# Patient Record
Sex: Male | Born: 1995 | Race: Black or African American | Hispanic: No | Marital: Single | Smoking: Never smoker
Health system: Southern US, Academic
[De-identification: ages and names within clinical notes are randomized; demographics above are authoritative.]

## PROBLEM LIST (undated history)

## (undated) ENCOUNTER — Emergency Department (HOSPITAL_COMMUNITY): Admission: EM | Payer: Self-pay

## (undated) DIAGNOSIS — F319 Bipolar disorder, unspecified: Secondary | ICD-10-CM

## (undated) DIAGNOSIS — F988 Other specified behavioral and emotional disorders with onset usually occurring in childhood and adolescence: Secondary | ICD-10-CM

## (undated) DIAGNOSIS — J45909 Unspecified asthma, uncomplicated: Secondary | ICD-10-CM

## (undated) DIAGNOSIS — F429 Obsessive-compulsive disorder, unspecified: Secondary | ICD-10-CM

## (undated) DIAGNOSIS — R569 Unspecified convulsions: Secondary | ICD-10-CM

## (undated) DIAGNOSIS — R6250 Unspecified lack of expected normal physiological development in childhood: Secondary | ICD-10-CM

## (undated) HISTORY — DX: Unspecified lack of expected normal physiological development in childhood: R62.50

## (undated) HISTORY — DX: Unspecified convulsions (CMS HCC): R56.9

---
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
## 2014-02-10 ENCOUNTER — Other Ambulatory Visit (INDEPENDENT_AMBULATORY_CARE_PROVIDER_SITE_OTHER): Payer: Self-pay | Admitting: General Practice

## 2014-02-10 DIAGNOSIS — G40909 Epilepsy, unspecified, not intractable, without status epilepticus: Secondary | ICD-10-CM

## 2014-02-10 MED ORDER — LAMOTRIGINE 25 MG TABLET
25.0000 mg | ORAL_TABLET | Freq: Two times a day (BID) | ORAL | Status: DC
Start: 2014-02-10 — End: 2015-02-09

## 2014-02-10 MED ORDER — LAMOTRIGINE 100 MG TABLET
100.0000 mg | ORAL_TABLET | Freq: Two times a day (BID) | ORAL | Status: DC
Start: 2014-02-10 — End: 2015-02-09

## 2014-02-10 MED ORDER — LAMOTRIGINE 200 MG TABLET
200.0000 mg | ORAL_TABLET | Freq: Two times a day (BID) | ORAL | Status: DC
Start: 2014-02-10 — End: 2015-02-09

## 2014-02-10 NOTE — Telephone Encounter (Signed)
Spoke with denise and verified lamictal refill dosages and preferred pharmacy [facility]  Called lamictal 200 mg,100mg  and 25 mg refills to [facility]

## 2014-02-10 NOTE — Telephone Encounter (Signed)
-----   Message from Hardin Negusena McDonald sent at 02/10/2014 12:09 PM EST -----  >> [facility] 02/10/2014 12:09 PM  Dr Charolotte EkePergami pt- Angelique BlonderDenise is calling to get Rochell Puett refill on    lamoTRIgine (LAMICTAL) 100 mg Oral Tablet, Take 1 Tab (100 mg total) by mouth Twice daily, Disp: 60 Tab, Rfl: 8  LamoTRIgine (LAMICTAL) 200 mg Oral Tablet, Take 1 Tab (200 mg total) by mouth Twice daily, Disp: 30 Tab, Rfl: 8  lamoTRIgine (LAMICTAL) 25 mg Oral Tablet, Take 1 Tab (25 mg total) by mouth Twice daily, Disp: 60 Tab, Rfl: 8      Preferred Pharmacy     [facility] [facility] [place], [place] - [address]    [address] [place] [id number]    Phone: [phone] Fax: [phone]    Open 24 Hours?: No

## 2014-04-30 ENCOUNTER — Ambulatory Visit
Payer: MEDICAID | Attending: PSYCHIATRY AND NEUROLOGY-NEUROLOGY WITH SPECIAL QUALIFICATIONS IN CHILD NEUROLOGY | Admitting: PSYCHIATRY AND NEUROLOGY-NEUROLOGY WITH SPECIAL QUALIFICATIONS IN CHILD NEUROLOGY

## 2014-04-30 VITALS — BP 111/72 | HR 88 | Temp 98.6°F | Ht 71.34 in | Wt 174.4 lb

## 2014-04-30 DIAGNOSIS — G40909 Epilepsy, unspecified, not intractable, without status epilepticus: Secondary | ICD-10-CM

## 2014-04-30 DIAGNOSIS — F319 Bipolar disorder, unspecified: Secondary | ICD-10-CM | POA: Insufficient documentation

## 2014-04-30 NOTE — Progress Notes (Signed)
CC: epilepsy since 2007; has bipolar  The [facility] that is scanned in to today's visit, was reviewed and additions added to the note and letter. Vitals were reviewed with the following noted as ok    grandmother related the following:  Typical seizure - eyes roll, shakes all over with arms in the air;   Last seizure - Feb 2015  Had 6 seizures in 2014 and the one in Feb of last year  She can tell he is going into one by His look;  Thinks better sleep has helped him - trazodone has helped as treating His bipolar    Follows with Dr. Rosina LowensteinJhafri - feels he does not focus on medication but helps them with how to deal with behavior    EXAM  General appearance - no acute distress  Mental status - alert  Language- age appropriate  Gait - no ataxia  Coordination - RAM  Cranial Nerves - intact  Strength 5/5 throughout; normal tone  DTRs - 2+  Throughout      A/P epilepsy   - continue Lamictal at current dose   - we would keep him 4 years   - RTC 6 months - then will look to send to adult neurologist

## 2014-12-04 ENCOUNTER — Inpatient Hospital Stay
Admit: 2014-12-04 | Discharge: 2014-12-04 | Disposition: A | Discharge status: Home / Self Care | Payer: Self-pay | Attending: Emergency Medicine

## 2014-12-04 DIAGNOSIS — F1290 Cannabis use, unspecified, uncomplicated: Secondary | ICD-10-CM

## 2014-12-04 LAB — DRUG SCREEN, URINE
AMPHETAMINES: NEGATIVE
BARBITURATES: NEGATIVE
BENZODIAZEPINES: NEGATIVE
COCAINE: NEGATIVE
METHADONE: NEGATIVE
OPIATES: NEGATIVE
PCP(PHENCYCLIDINE): NEGATIVE
THC (TH-CANNABINOL): POSITIVE — AB

## 2014-12-04 LAB — EKG, 12 LEAD, INITIAL
Atrial Rate: 114 {beats}/min
Calculated P Axis: 62 degrees
Calculated R Axis: 61 degrees
Calculated T Axis: 39 degrees
P-R Interval: 148 ms
Q-T Interval: 326 ms
QRS Duration: 80 ms
QTC Calculation (Bezet): 449 ms
Ventricular Rate: 114 {beats}/min

## 2014-12-04 NOTE — ED Triage Notes (Signed)
Patient brought in by EMS, tonight at his cousins house and had "two hits" of marijuana and has never smoked before. Patient started feeling anxious.

## 2014-12-04 NOTE — ED Notes (Signed)
I have reviewed discharge instructions with the patient and parent.  The patient and parent verbalized understanding. Patient armband removed and shredded. Patient ambulated to lobby with mother with steady gait.

## 2014-12-04 NOTE — ED Provider Notes (Signed)
HPI Comments:   12:21 AM    Ian Boyle is a 19 y.o. Male with hx of bipolar disorder and epilepsy presents to ED via EMS with mother C/O palpitations throat burning, and dizziness s/p smoking marijuana for the first time PTA. Pt's mother reports he takes 650 mg Lamictal, Trazodone, and Abilify daily but has not taken his medications today. He denies any other drug use. Pt denies chest pain, SOB, abdominal pain, and any other symptoms or complaints.         Patient is a 19 y.o. male presenting with anxiety. The history is provided by the patient. No language interpreter was used.   Anxiety    This is a new problem. Episode onset: PTA. The problem has not changed since onset.The problem occurs constantly. Associated symptoms include dizziness and palpitations. Pertinent negatives include no abdominal pain and no shortness of breath.        Past Medical History:   Diagnosis Date   ??? Bipolar affective disorder (HCC)    ??? Epilepsy (HCC)        History reviewed. No pertinent past surgical history.      History reviewed. No pertinent family history.    Social History     Social History   ??? Marital status: SINGLE     Spouse name: N/A   ??? Number of children: N/A   ??? Years of education: N/A     Occupational History   ??? Not on file.     Social History Main Topics   ??? Smoking status: Never Smoker   ??? Smokeless tobacco: Not on file   ??? Alcohol use No   ??? Drug use: Yes     Special: Marijuana   ??? Sexual activity: Not on file     Other Topics Concern   ??? Not on file     Social History Narrative   ??? No narrative on file         ALLERGIES: Augmentin [amoxicillin-pot clavulanate] and Pcn [penicillins]    Review of Systems   HENT: Positive for sore throat (burning).    Respiratory: Negative for shortness of breath.    Cardiovascular: Positive for palpitations. Negative for chest pain.   Gastrointestinal: Negative for abdominal pain.   Neurological: Positive for dizziness.   All other systems reviewed and are negative.      Vitals:     12/04/14 0016 12/04/14 0138   BP: (!) 147/92 121/75   Pulse: (!) 119 87   Resp: 16 18   Temp: 98.3 ??F (36.8 ??C)    SpO2: 100% 100%   Weight: 81.6 kg (180 lb)    Height: 6\' 1"  (1.854 m)             Physical Exam   Constitutional: He is oriented to person, place, and time. He appears well-developed and well-nourished. No distress.   Lying quietly on bed, alert, oriented x 4, appears anxious    HENT:   Head: Normocephalic and atraumatic.   Mouth/Throat: Uvula is midline and oropharynx is clear and moist. Mucous membranes are dry.   Eyes: EOM are normal. Pupils are equal, round, and reactive to light.   Blood shot and glassy eyes  Sluggish pupillary response    Neck: Normal range of motion. Neck supple.   Cardiovascular: Regular rhythm, normal heart sounds and intact distal pulses.  Tachycardia present.    No murmur heard.  Pulmonary/Chest: Effort normal and breath sounds normal. No respiratory distress. He has no  wheezes. He has no rales.   Abdominal: Soft. Bowel sounds are normal. He exhibits no distension. There is no tenderness. There is no rebound and no guarding.   Neurological: He is alert and oriented to person, place, and time.   Skin: Skin is warm and dry. No rash noted.   Psychiatric: His speech is normal. Judgment and thought content normal. His mood appears anxious. He is slowed. He is not agitated, not aggressive, not hyperactive, not withdrawn, not actively hallucinating and not combative. Cognition and memory are normal. He is attentive.   Nursing note and vitals reviewed.     RESULTS:    EKG interpretation: (Preliminary)  0:35 AM   Sinus tachycardia with occasional premature ventricular complexes, 114bpm, otherwise normal ECG  EKG read by Gwen Her, PA-C      No orders to display        Labs Reviewed   DRUG SCREEN, URINE - Abnormal; Notable for the following:        Result Value    THC (TH-CANNABINOL) POSITIVE (*)     All other components within normal limits        No results found for this or any previous visit (from the past 12 hour(s)).     MDM  Number of Diagnoses or Management Options  Anxiety:   Marijuana use:   Diagnosis management comments: Anxiety, marijuana use, r/o other drug use       Amount and/or Complexity of Data Reviewed  Tests in the medicine section of CPT??: ordered and reviewed (EKG)  Independent visualization of images, tracings, or specimens: yes (EKG)      ED Course     MEDICATIONS GIVEN:  Medications - No data to display     Procedures    PROGRESS NOTE:  12:21 AM   Initial assessment performed.    DISCUSSION:  Pt anxious after smoking marijuana for the first time tonight, UDS + for marijuana only. Pt initially tachycardic with elevated BP but normal HR and BP after resting and drinking water. EKG NSR with occasional PVC. Pt stable for d/c. Mother at bedside     DISCHARGE NOTE:  1:10 AM   Cleotilde Neer  results have been reviewed with him.  He has been counseled regarding his diagnosis, treatment, and plan.  He verbally conveys understanding and agreement of the signs, symptoms, diagnosis, treatment and prognosis and additionally agrees to follow up as discussed.  He also agrees with the care-plan and conveys that all of his questions have been answered.  I have also provided discharge instructions for him that include: educational information regarding their diagnosis and treatment, and list of reasons why they would want to return to the ED prior to their follow-up appointment, should his condition change. The patient and/or family has been provided with education for proper Emergency Department utilization.    CLINICAL IMPRESSION:    1. Marijuana use    2. Anxiety        PLAN: DISCHARGE HOME    Follow-up Information     Follow up With Details Comments Contact Info    [facility] Schedule an appointment as soon as possible for a visit  [address] [place] [address]  [place] [address]  [phone]     Esperanza Richters, DO Schedule an appointment as soon as possible for a visit Follow up with internal medicine  [address] [place] [address]  [phone]  [facility] EMERGENCY DEPT  As needed, If symptoms worsen [address]  [place] [address]  [phone]          Discharge Medication List as of 12/04/2014  1:17 AM      CONTINUE these medications which have NOT CHANGED    Details   lamoTRIgine (LAMICTAL) 100 mg tablet Take 650 mg by mouth two (2) times a day., Historical Med      traZODone (DESYREL) 100 mg tablet Take 25 mg by mouth nightly., Historical Med      albuterol (PROAIR HFA) 90 mcg/actuation inhaler Take 2 Puffs by inhalation every four (4) hours as needed for Wheezing., Historical Med      OLANZapine (ZYPREXA) 5 mg tablet Take 5 mg by mouth nightly., Historical Med             ATTESTATIONS:  This note is prepared by Marchia Meiers, acting as Scribe for [employer], PA-C .    Gwen Her, PA-C : The scribe's documentation has been prepared under my direction and personally reviewed by me in its entirety. I confirm that the note above accurately reflects all work, treatment, procedures, and medical decision making performed by me.

## 2015-02-09 ENCOUNTER — Other Ambulatory Visit (INDEPENDENT_AMBULATORY_CARE_PROVIDER_SITE_OTHER): Payer: Self-pay | Admitting: PSYCHIATRY AND NEUROLOGY-NEUROLOGY WITH SPECIAL QUALIFICATIONS IN CHILD NEUROLOGY

## 2015-02-09 DIAGNOSIS — G40909 Epilepsy, unspecified, not intractable, without status epilepticus: Secondary | ICD-10-CM

## 2015-02-09 MED ORDER — LAMOTRIGINE 100 MG TABLET
100.0000 mg | ORAL_TABLET | Freq: Two times a day (BID) | ORAL | Status: DC
Start: 2015-02-09 — End: 2015-09-27

## 2015-02-09 MED ORDER — LAMOTRIGINE 200 MG TABLET
200.0000 mg | ORAL_TABLET | Freq: Two times a day (BID) | ORAL | Status: DC
Start: 2015-02-09 — End: 2015-09-27

## 2015-02-09 MED ORDER — LAMOTRIGINE 25 MG TABLET
25.0000 mg | ORAL_TABLET | Freq: Two times a day (BID) | ORAL | Status: DC
Start: 2015-02-09 — End: 2015-09-27

## 2015-02-09 NOTE — Telephone Encounter (Signed)
-----   Message from Almyra BraceLisa Garrison sent at 02/09/2015 10:12 AM EST -----  >> LISA GARRISON 02/09/2015 10:12 AM  Doctor Name:   PED NEURO/DR Okey DupreJAYNES       Date of last appointment:  1.30.17    Next scheduled visit:  2.6.16    Medication Requested:    lamoTRIgine (LAMICTAL) 100 mg Oral Tablet, Take 1 Tab (100 mg total) by mouth Twice daily  LamoTRIgine (LAMICTAL) 200 mg Oral Tablet, Take 1 Tab (200 mg total) by mouth Twice daily  lamoTRIgine (LAMICTAL) 25 mg Oral Tablet, Take 1 Tab (25 mg total) by mouth Twice daily        Medication issues or side effects that need reported to nurse or physician:  N/Ayzia Day    Preferred Pharmacy: Preferred Pharmacy     [facility] - [place], [place] - [address]    [address] [place] [id number]    Phone: [phone] Fax: [phone]    Open 24 Hours?: No          Notes for Nurse or Physician:  N/Caylen Yardley

## 2015-02-09 NOTE — Telephone Encounter (Signed)
Spoke with denise and verified dosage lamictal 200 mg,100mg , and 25 mg for refills [facility] as requested

## 2015-04-24 ENCOUNTER — Encounter (INDEPENDENT_AMBULATORY_CARE_PROVIDER_SITE_OTHER): Payer: MEDICAID | Admitting: PSYCHIATRY AND NEUROLOGY-NEUROLOGY WITH SPECIAL QUALIFICATIONS IN CHILD NEUROLOGY

## 2015-09-25 ENCOUNTER — Encounter (INDEPENDENT_AMBULATORY_CARE_PROVIDER_SITE_OTHER): Payer: MEDICAID | Admitting: PSYCHIATRY AND NEUROLOGY-NEUROLOGY WITH SPECIAL QUALIFICATIONS IN CHILD NEUROLOGY

## 2015-09-27 ENCOUNTER — Encounter (INDEPENDENT_AMBULATORY_CARE_PROVIDER_SITE_OTHER): Payer: Self-pay | Admitting: PSYCHIATRY AND NEUROLOGY-NEUROLOGY WITH SPECIAL QUALIFICATIONS IN CHILD NEUROLOGY

## 2015-09-27 ENCOUNTER — Ambulatory Visit
Payer: MEDICAID | Attending: PSYCHIATRY AND NEUROLOGY-NEUROLOGY WITH SPECIAL QUALIFICATIONS IN CHILD NEUROLOGY | Admitting: PSYCHIATRY AND NEUROLOGY-NEUROLOGY WITH SPECIAL QUALIFICATIONS IN CHILD NEUROLOGY

## 2015-09-27 DIAGNOSIS — Z79899 Other long term (current) drug therapy: Secondary | ICD-10-CM | POA: Insufficient documentation

## 2015-09-27 DIAGNOSIS — F319 Bipolar disorder, unspecified: Secondary | ICD-10-CM | POA: Insufficient documentation

## 2015-09-27 DIAGNOSIS — G40909 Epilepsy, unspecified, not intractable, without status epilepticus: Secondary | ICD-10-CM | POA: Insufficient documentation

## 2015-09-27 MED ORDER — LAMOTRIGINE 200 MG TABLET
200.0000 mg | ORAL_TABLET | Freq: Two times a day (BID) | ORAL | 8 refills | Status: AC
Start: 2015-09-27 — End: ?

## 2015-09-27 MED ORDER — LAMOTRIGINE 100 MG TABLET
100.0000 mg | ORAL_TABLET | Freq: Two times a day (BID) | ORAL | 8 refills | Status: AC
Start: 2015-09-27 — End: ?

## 2015-09-27 MED ORDER — LAMOTRIGINE 25 MG TABLET
25.0000 mg | ORAL_TABLET | Freq: Two times a day (BID) | ORAL | 8 refills | Status: AC
Start: 2015-09-27 — End: ?

## 2015-09-27 NOTE — Progress Notes (Signed)
CC: epilepsy since 2007; has bipolar  The [facility] that is scanned in to today's visit, was reviewed and additions added to the note and letter. Vitals were reviewed with the following noted as ok    grandmother related the following:  Typical seizure - eyes roll, shakes all over with arms in the air;   Last seizure - late Feb 2017  Had 6 seizures in 2014 and the one in Feb of last year  She can tell he is going into one by His look;    Follows with Dr. Rosina LowensteinJhafri - feels he does not focus on medication but helps them with how to deal with behavior    EXAM  Vitals:    09/27/15 1327   BP: 116/82   Pulse: 73   Temp: 36.6 C (97.9 F)   TempSrc: Thermal Scan   SpO2: 99%   Weight: 84.1 kg (185 lb 6.5 oz)   Height: 1.799 m (5' 10.83")   HC: 57 cm (22.44")       General appearance - no acute distress  Mental status - alert  Language- age appropriate  Gait - no ataxia  Coordination - RAM  Cranial Nerves - intact  Strength 5/5 throughout; normal tone  DTRs - 2+  Throughout      A/P epilepsy   - continue Lamictal at current dose   - we would keep him on meds for 4 years seizure free   - RTC 6 months - then will look to send to adult neurologist

## 2016-04-10 ENCOUNTER — Encounter (INDEPENDENT_AMBULATORY_CARE_PROVIDER_SITE_OTHER): Payer: MEDICAID | Admitting: PSYCHIATRY AND NEUROLOGY-NEUROLOGY WITH SPECIAL QUALIFICATIONS IN CHILD NEUROLOGY

## 2016-04-27 ENCOUNTER — Encounter (INDEPENDENT_AMBULATORY_CARE_PROVIDER_SITE_OTHER): Payer: MEDICAID | Admitting: PSYCHIATRY AND NEUROLOGY-NEUROLOGY WITH SPECIAL QUALIFICATIONS IN CHILD NEUROLOGY

## 2016-07-15 ENCOUNTER — Encounter (INDEPENDENT_AMBULATORY_CARE_PROVIDER_SITE_OTHER): Payer: MEDICAID | Admitting: PSYCHIATRY AND NEUROLOGY-NEUROLOGY WITH SPECIAL QUALIFICATIONS IN CHILD NEUROLOGY

## 2017-06-19 ENCOUNTER — Ambulatory Visit (INDEPENDENT_AMBULATORY_CARE_PROVIDER_SITE_OTHER): Payer: Self-pay

## 2017-06-19 NOTE — Telephone Encounter (Signed)
Mom is going to call PCP to get referral to adult neurology as has not been seen for over 2 years

## 2017-06-19 NOTE — Telephone Encounter (Signed)
-----   Message from Hardin Negusena McDonald sent at 06/19/2017  1:59 PM EDT -----  Dr Okey DupreJaynes pt- mom is calling to find out if Dr Okey DupreJaynes will continue to see this 22 yr old. He has been having twitching.

## 2019-01-20 ENCOUNTER — Other Ambulatory Visit: Payer: Self-pay

## 2019-01-20 ENCOUNTER — Emergency Department (HOSPITAL_COMMUNITY)
Admission: EM | Admit: 2019-01-20 | Discharge: 2019-01-20 | Discharge status: Against Medical Advice | Payer: Self-pay | Attending: Emergency Medicine | Admitting: Emergency Medicine

## 2019-01-20 ENCOUNTER — Encounter (HOSPITAL_COMMUNITY): Payer: Self-pay | Admitting: Emergency Medicine

## 2019-01-20 ENCOUNTER — Emergency Department (HOSPITAL_COMMUNITY): Payer: Self-pay

## 2019-01-20 DIAGNOSIS — Z5321 Procedure and treatment not carried out due to patient leaving prior to being seen by health care provider: Secondary | ICD-10-CM | POA: Insufficient documentation

## 2019-01-20 HISTORY — DX: Bipolar disorder, unspecified: F31.9

## 2019-01-20 HISTORY — DX: Obsessive-compulsive disorder, unspecified: F42.9

## 2019-01-20 HISTORY — DX: Unspecified asthma, uncomplicated: J45.909

## 2019-01-20 HISTORY — DX: Other specified behavioral and emotional disorders with onset usually occurring in childhood and adolescence: F98.8

## 2019-01-20 HISTORY — DX: Unspecified convulsions: R56.9

## 2019-01-20 NOTE — ED Notes (Signed)
Patient advised nurse that he is leaving and will see his MD in the morning .

## 2019-01-20 NOTE — ED Triage Notes (Signed)
Pt with c/o cough, fever of 102, emesis and diarrhea. He works at [employer] and is concerned that he may have gotten COVID.

## 2019-01-21 ENCOUNTER — Emergency Department (HOSPITAL_COMMUNITY)
Admission: EM | Admit: 2019-01-21 | Discharge: 2019-01-21 | Disposition: A | Discharge status: Home / Self Care | Payer: Self-pay | Attending: Emergency Medicine | Admitting: Emergency Medicine

## 2019-01-21 ENCOUNTER — Encounter (HOSPITAL_COMMUNITY): Payer: Self-pay | Admitting: Emergency Medicine

## 2019-01-21 DIAGNOSIS — R197 Diarrhea, unspecified: Secondary | ICD-10-CM | POA: Insufficient documentation

## 2019-01-21 DIAGNOSIS — R0789 Other chest pain: Secondary | ICD-10-CM | POA: Insufficient documentation

## 2019-01-21 DIAGNOSIS — R112 Nausea with vomiting, unspecified: Secondary | ICD-10-CM | POA: Insufficient documentation

## 2019-01-21 DIAGNOSIS — F17210 Nicotine dependence, cigarettes, uncomplicated: Secondary | ICD-10-CM | POA: Insufficient documentation

## 2019-01-21 DIAGNOSIS — Z20828 Contact with and (suspected) exposure to other viral communicable diseases: Secondary | ICD-10-CM | POA: Insufficient documentation

## 2019-01-21 DIAGNOSIS — J029 Acute pharyngitis, unspecified: Secondary | ICD-10-CM | POA: Insufficient documentation

## 2019-01-21 DIAGNOSIS — Z20822 Contact with and (suspected) exposure to covid-19: Secondary | ICD-10-CM

## 2019-01-21 DIAGNOSIS — F909 Attention-deficit hyperactivity disorder, unspecified type: Secondary | ICD-10-CM | POA: Insufficient documentation

## 2019-01-21 DIAGNOSIS — J069 Acute upper respiratory infection, unspecified: Secondary | ICD-10-CM

## 2019-01-21 DIAGNOSIS — F319 Bipolar disorder, unspecified: Secondary | ICD-10-CM | POA: Insufficient documentation

## 2019-01-21 LAB — CBC WITH DIFFERENTIAL/PLATELET
Abs Immature Granulocytes: 0.02 10*3/uL (ref 0.00–0.07)
Basophils Absolute: 0.1 10*3/uL (ref 0.0–0.1)
Basophils Relative: 1 %
Eosinophils Absolute: 0.2 10*3/uL (ref 0.0–0.5)
Eosinophils Relative: 5 %
HCT: 48 % (ref 39.0–52.0)
Hemoglobin: 15.1 g/dL (ref 13.0–17.0)
Immature Granulocytes: 1 %
Lymphocytes Relative: 41 %
Lymphs Abs: 1.5 10*3/uL (ref 0.7–4.0)
MCH: 29.2 pg (ref 26.0–34.0)
MCHC: 31.5 g/dL (ref 30.0–36.0)
MCV: 92.8 fL (ref 80.0–100.0)
Monocytes Absolute: 0.4 10*3/uL (ref 0.1–1.0)
Monocytes Relative: 11 %
Neutro Abs: 1.4 10*3/uL — ABNORMAL LOW (ref 1.7–7.7)
Neutrophils Relative %: 41 %
Platelets: 307 10*3/uL (ref 150–400)
RBC: 5.17 MIL/uL (ref 4.22–5.81)
RDW: 14.6 % (ref 11.5–15.5)
WBC: 3.5 10*3/uL — ABNORMAL LOW (ref 4.0–10.5)
nRBC: 0 % (ref 0.0–0.2)

## 2019-01-21 LAB — COMPREHENSIVE METABOLIC PANEL
ALT: 18 U/L (ref 0–44)
AST: 19 U/L (ref 15–41)
Albumin: 4 g/dL (ref 3.5–5.0)
Alkaline Phosphatase: 43 U/L (ref 38–126)
Anion gap: 9 (ref 5–15)
BUN: 11 mg/dL (ref 6–20)
CO2: 24 mmol/L (ref 22–32)
Calcium: 9.3 mg/dL (ref 8.9–10.3)
Chloride: 108 mmol/L (ref 98–111)
Creatinine, Ser: 0.84 mg/dL (ref 0.61–1.24)
GFR calc Af Amer: >60 mL/min (ref >60)
GFR calc non Af Amer: >60 mL/min (ref >60)
Glucose, Bld: 110 mg/dL — ABNORMAL HIGH (ref 70–99)
Potassium: 4.7 mmol/L (ref 3.5–5.1)
Sodium: 141 mmol/L (ref 135–145)
Total Bilirubin: 0.4 mg/dL (ref 0.3–1.2)
Total Protein: 7 g/dL (ref 6.5–8.1)

## 2019-01-21 MED ORDER — ONDANSETRON HCL 4 MG PO TABS: 4.0000 mg | ORAL_TABLET | Freq: Three times a day (TID) | ORAL | 0 refills | Status: AC | PRN

## 2019-01-21 MED ORDER — BENZONATATE 100 MG PO CAPS: 100.0000 mg | ORAL_CAPSULE | Freq: Three times a day (TID) | ORAL | 0 refills | Status: AC

## 2019-01-21 MED ORDER — ONDANSETRON 4 MG PO TBDP: 4.0000 mg | ORAL_TABLET | Freq: Once | ORAL | Status: DC

## 2019-01-21 MED ORDER — ALBUTEROL SULFATE HFA 108 (90 BASE) MCG/ACT IN AERS
4.0000 | INHALATION_SPRAY | Freq: Once | RESPIRATORY_TRACT | Status: AC
  Administered 2019-01-21: 4 via RESPIRATORY_TRACT
  Filled 2019-01-21: qty 6.7

## 2019-01-21 NOTE — ED Provider Notes (Addendum)
[facility] EMERGENCY DEPARTMENT Provider Note   CSN: [id number] Arrival date & time: 01/21/19  1216     History   Chief Complaint Chief Complaint  Patient presents with  . Fever    HPI Keith Pearson is a 23 y.o. male.     HPI    Patient is a 23 year old male with a history of ADHD, asthma, bipolar 1, OCD, seizures, who presents the emergency department today for evaluation of multiple complaints and concern for the coronavirus.  Patient reports that he has had dry cough, fevers, nausea, vomiting and diarrhea for the last 5 to 6 days.  He denies any significant abdominal discomfort.  He does report some chest tightness.  He has little bit of nasal congestion and a mild sore throat as well.  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  He does not know that he has been around anybody with positive Covid testing.  He does have concern for this.  Past Medical History:  Diagnosis Date  . ADD (attention deficit disorder)   . Asthma   . Bipolar 1 disorder ([place])   . OCD (obsessive compulsive disorder)   . Seizures ([place])     There are no active problems to display for this patient.   History reviewed. No pertinent surgical history.      Home Medications    Prior to Admission medications   Medication Sig Start Date End Date Taking? Authorizing Provider  benzonatate (TESSALON) 100 MG capsule Take 1 capsule (100 mg total) by mouth every 8 (eight) hours for 5 days. 01/21/19 01/26/19  Joseh Sjogren S, PA-C  ondansetron (ZOFRAN) 4 MG tablet Take 1 tablet (4 mg total) by mouth every 8 (eight) hours as needed for nausea or vomiting. 01/21/19   Rachard Isidro S, PA-C    Family History History reviewed. No pertinent family history.  Social History Social History   Tobacco Use  . Smoking status: Current Every Day Smoker    Types:  Cigarettes  . Smokeless tobacco: Never Used  Substance Use Topics  . Alcohol use: Yes  . Drug use: Yes    Types: Marijuana     Allergies   Augmentin [amoxicillin-pot clavulanate] and Penicillins   Review of Systems Review of Systems  Constitutional: Positive for fever.  HENT: Positive for congestion and sore throat. Negative for ear pain.   Eyes: Negative for pain and visual disturbance.  Respiratory: Positive for cough. Negative for shortness of breath.   Cardiovascular: Positive for chest pain (tightness).  Gastrointestinal: Positive for diarrhea, nausea and vomiting. Negative for abdominal pain.  Genitourinary: Negative for dysuria and hematuria.  Musculoskeletal: Negative for back pain.  Skin: Negative for rash.  Neurological: Negative for headaches.  All other systems reviewed and are negative.    Physical Exam Updated Vital Signs BP (!) 148/92 (BP Location: Right Arm)   Pulse 89   Temp 97.6 F (36.4 C) (Oral)   Resp 18   SpO2 100%   Physical Exam Vitals signs and nursing note reviewed.  Constitutional:      General: He is not in acute distress.    Appearance: He is well-developed. He is not ill-appearing or toxic-appearing.  HENT:     Head: Normocephalic and atraumatic.     Mouth/Throat:     Pharynx: No oropharyngeal exudate or posterior oropharyngeal erythema.  Comments: No tonsillar edema or exudates.  Eyes:     Conjunctiva/sclera: Conjunctivae normal.  Neck:     Musculoskeletal: Neck supple.  Cardiovascular:     Rate and Rhythm: Normal rate and regular rhythm.     Pulses: Normal pulses.     Heart sounds: Normal heart sounds. No murmur.  Pulmonary:     Effort: Pulmonary effort is normal. No respiratory distress.     Breath sounds: Normal breath sounds. No wheezing, rhonchi or rales.  Abdominal:     General: Bowel sounds are normal.     Palpations: Abdomen is soft.     Tenderness: There is no abdominal tenderness. There is no guarding or  rebound.  Lymphadenopathy:     Cervical: No cervical adenopathy.  Skin:    General: Skin is warm and dry.  Neurological:     Mental Status: He is alert.      ED Treatments / Results  Labs (all labs ordered are listed, but only abnormal results are displayed) Labs Reviewed  COMPREHENSIVE METABOLIC PANEL - Abnormal; Notable for the following components:      Result Value   Glucose, Bld 110 (*)    All other components within normal limits  CBC WITH DIFFERENTIAL/PLATELET - Abnormal; Notable for the following components:   WBC 3.5 (*)    Neutro Abs 1.4 (*)    All other components within normal limits  NOVEL CORONAVIRUS, NAA (HOSP ORDER, SEND-OUT TO REF LAB; TAT 18-24 HRS)    EKG None  Radiology Dg Chest Portable 1 View  Result Date: 01/20/2019 CLINICAL DATA:  23 year old male with cough and fever. EXAM: PORTABLE CHEST 1 VIEW COMPARISON:  None. FINDINGS: The heart size and mediastinal contours are within normal limits. Both lungs are clear. The visualized skeletal structures are unremarkable. IMPRESSION: No active disease. Electronically Signed   By: Elgie Collard M.D.   On: 01/20/2019 19:29    Procedures Procedures (including critical care time)  Medications Ordered in ED Medications  albuterol (VENTOLIN HFA) 108 (90 Base) MCG/ACT inhaler 4 puff (4 puffs Inhalation Given 01/21/19 1538)     Initial Impression / Assessment and Plan / ED Course  I have reviewed the triage vital signs and the nursing notes.  Pertinent labs & imaging results that were available during my care of the patient were reviewed by me and considered in my medical decision making (see chart for details).   Final Clinical Impressions(s) / ED Diagnoses   Final diagnoses:  Nausea vomiting and diarrhea  Upper respiratory tract infection, unspecified type  Suspected COVID-19 virus infection   Patient reports that he has had dry cough, fevers, nausea, vomiting and diarrhea for the last 5 to 6  days.  He denies any significant abdominal discomfort.  He does report some chest tightness.  He has little bit of nasal congestion and a mild sore throat as well.  He has been around people are coughing but denies any known coronavirus exposures.  Vital signs are reassuring.  Exam is reassuring.  Reviewed labs.  Patient with mild leukopenia, no anemia.  Leukopenia somewhat suggestive of Covid infection.  CMP with normal electrolytes liver and kidney function.  Patient able to tolerate p.o. in the ED.  Abdomen soft and nontender, low suspicion for emergent intra-abdominal/pelvic pathology.  Lungs are clear to auscultation bilaterally.  He was seen in the ED yesterday but left without being seen.  He had chest x-ray at that time which was reviewed by myself which was negative for  any obvious abnormalities.  He was given albuterol inhaler in the ED.  We will test him for Covid.  He appears stable for discharge with close outpatient follow-up.  I will give him symptomatic management for his nausea and vomiting.  Advised on return precautions and quarantine precautions.  He voices understanding and is in agreement.  All questions answered.  Patient stable for discharge.  -----  Keith Pearson was evaluated in Emergency Department on 01/21/2019 for the symptoms described in the history of present illness. He was evaluated in the context of the global COVID-19 pandemic, which necessitated consideration that the patient might be at risk for infection with the SARS-CoV-2 virus that causes COVID-19. Institutional protocols and algorithms that pertain to the evaluation of patients at risk for COVID-19 are in a state of rapid change based on information released by regulatory bodies including the CDC and federal and state organizations. These policies and algorithms were followed during the patient's care in the ED.   ED Discharge Orders         Ordered    benzonatate (TESSALON) 100 MG capsule  Every 8  hours     01/21/19 1538    ondansetron (ZOFRAN) 4 MG tablet  Every 8 hours PRN     01/21/19 1538           [place], Saks IncorporatedCortni S, PA-C 01/21/19 1537    Imagene Boss S, PA-C 01/21/19 1608    Terrilee FilesButler, Michael C, MD 01/21/19 [phone]

## 2019-01-21 NOTE — ED Triage Notes (Signed)
Pt to ER for evaluation of cough, fever, nausea, vomiting, and diarrhea. Was here last night and LWBS but is concerned he has COVID.

## 2019-01-21 NOTE — Discharge Instructions (Addendum)
You were give an albuterol inhaler for your symptoms. Please use two puff of the inhaler every 4-6 hours as needed for shortness of breath or cough. You were also given cough medication and nausea medication. Take as directed. Rotate tylenol and motrin to treat your fevers and body aches. Stay well hydrated.   Today you were were tested for the coronavirus.  The results will be available in the next 3 to 5 days.  If the results are positive the hospital will contact you.  If they are negative the hospital would not contact you.  You will need to self quarantine until you are aware of your results.  If they are positive you will need to self quarantine as directed below.  You should be isolated for at least 7 days since the onset of your symptoms AND >72 hours after symptoms resolution (absence of fever without the use of fever reducing medication and improvement in respiratory symptoms), whichever is longer  Please follow up with your primary care provider within 5-7 days for re-evaluation of your symptoms. If you do not have a primary care provider, information for a healthcare clinic has been provided for you to make arrangements for follow up care. Please return to the emergency department for any new or worsening symptoms.

## 2019-01-24 LAB — NOVEL CORONAVIRUS, NAA (HOSP ORDER, SEND-OUT TO REF LAB; TAT 18-24 HRS): SARS-CoV-2, NAA: NOT DETECTED

## 2019-01-25 ENCOUNTER — Telehealth: Payer: Self-pay | Admitting: General Practice

## 2019-01-25 NOTE — Telephone Encounter (Signed)
Per pt. Request, faxed COVID test results to employer, @ [place], [employer] @ [phone].

## 2019-01-25 NOTE — Telephone Encounter (Signed)
Negative COVID results given. Patient results "NOT Detected." Caller expressed understanding. ° °

## 2019-03-12 ENCOUNTER — Other Ambulatory Visit: Payer: Self-pay

## 2019-03-12 ENCOUNTER — Emergency Department (HOSPITAL_COMMUNITY)
Admission: EM | Admit: 2019-03-12 | Discharge: 2019-03-12 | Disposition: A | Discharge status: Home / Self Care | Payer: Self-pay | Attending: Emergency Medicine | Admitting: Emergency Medicine

## 2019-03-12 ENCOUNTER — Encounter (HOSPITAL_COMMUNITY): Payer: Self-pay | Admitting: Emergency Medicine

## 2019-03-12 DIAGNOSIS — S39012A Strain of muscle, fascia and tendon of lower back, initial encounter: Secondary | ICD-10-CM | POA: Insufficient documentation

## 2019-03-12 DIAGNOSIS — Y929 Unspecified place or not applicable: Secondary | ICD-10-CM | POA: Insufficient documentation

## 2019-03-12 DIAGNOSIS — Y9389 Activity, other specified: Secondary | ICD-10-CM | POA: Insufficient documentation

## 2019-03-12 DIAGNOSIS — X58XXXA Exposure to other specified factors, initial encounter: Secondary | ICD-10-CM | POA: Insufficient documentation

## 2019-03-12 DIAGNOSIS — Z711 Person with feared health complaint in whom no diagnosis is made: Secondary | ICD-10-CM | POA: Insufficient documentation

## 2019-03-12 DIAGNOSIS — J45909 Unspecified asthma, uncomplicated: Secondary | ICD-10-CM | POA: Insufficient documentation

## 2019-03-12 DIAGNOSIS — R369 Urethral discharge, unspecified: Secondary | ICD-10-CM | POA: Insufficient documentation

## 2019-03-12 DIAGNOSIS — F17210 Nicotine dependence, cigarettes, uncomplicated: Secondary | ICD-10-CM | POA: Insufficient documentation

## 2019-03-12 DIAGNOSIS — F1210 Cannabis abuse, uncomplicated: Secondary | ICD-10-CM | POA: Insufficient documentation

## 2019-03-12 DIAGNOSIS — Y998 Other external cause status: Secondary | ICD-10-CM | POA: Insufficient documentation

## 2019-03-12 LAB — RAPID HIV SCREEN (HIV 1/2 AB+AG)
HIV 1/2 Antibodies: NONREACTIVE
HIV-1 P24 Antigen - HIV24: NONREACTIVE

## 2019-03-12 MED ORDER — METHOCARBAMOL 500 MG PO TABS: 500.0000 mg | ORAL_TABLET | Freq: Two times a day (BID) | ORAL | 0 refills | Status: DC

## 2019-03-12 NOTE — ED Provider Notes (Signed)
[facility]   CSN: [id number] Arrival date & time: 03/12/19  1829     History Chief Complaint  Patient presents with   Back Pain    Keith Pearson is a 23 y.o. male who presents to ED for multiple complaints. His first complaint is back pain.  Reports bilateral back pain throughout his entire back for the past 2 weeks which she has had only minimal improvement with NSAIDs and Tylenol.  Pain is worse with movement and palpation.  Describes it as aching in nature.  States that he does a lot of lifting and moving for his job and believes this could be the cause of his pain.  He notes similar symptoms in the past.  He denies any injuries, falls, loss of bowel or bladder function, fever, abdominal pain, prior back surgery, history of cancer, she of IV drug use. Patient also complaining of penile discharge for the past 2 days.  He has had unprotected sexual intercourse with one male partner.  His male partner told him that she had a UTI but did not disclose any information regarding an STD.  He denies any rashes, lesions, testicular pain or swelling, history of STDs.  HPI     Past Medical History:  Diagnosis Date   ADD (attention deficit disorder)    Asthma    Bipolar 1 disorder ([facility])    OCD (obsessive compulsive disorder)    Seizures ([facility])     There are no problems to display for this patient.   History reviewed. No pertinent surgical history.     No family history on file.  Social History   Tobacco Use   Smoking status: Current Every Day Smoker    Types: Cigarettes   Smokeless tobacco: Never Used  Substance Use Topics   Alcohol use: Yes   Drug use: Yes    Types: Marijuana    Home Medications Prior to Admission medications   Medication Sig Start Date End Date Taking? Authorizing Provider  methocarbamol (ROBAXIN) 500 MG tablet Take 1 tablet (500 mg total) by mouth 2 (two) times daily. 03/12/19    [place] Kreitzer, PA-C  ondansetron (ZOFRAN) 4 MG tablet Take 1 tablet (4 mg total) by mouth every 8 (eight) hours as needed for nausea or vomiting. 01/21/19   Couture, Cortni S, PA-C    Allergies    Augmentin [amoxicillin-pot clavulanate] and Penicillins  Review of Systems   Review of Systems  Constitutional: Negative for chills and fever.  Gastrointestinal: Negative for nausea and vomiting.  Genitourinary: Positive for discharge. Negative for flank pain, penile pain, penile swelling and testicular pain.  Musculoskeletal: Positive for back pain and myalgias.  Neurological: Negative for weakness and numbness.    Physical Exam Updated Vital Signs BP 140/84    Pulse 89    Temp 98.9 F (37.2 C) (Oral)    Resp 16    SpO2 100%   Physical Exam Vitals and nursing note reviewed. Exam conducted with a chaperone present.  Constitutional:      General: He is not in acute distress.    Appearance: He is well-developed. He is not diaphoretic.  HENT:     Head: Normocephalic and atraumatic.  Eyes:     General: No scleral icterus.    Conjunctiva/sclera: Conjunctivae normal.  Pulmonary:     Effort: Pulmonary effort is normal. No respiratory distress.  Genitourinary:    Penis: Discharge present.      Testes:  Right: Tenderness not present.        Left: Tenderness not present.     Comments: Normal male genitalia noted. Penis, scrotum, and testicles without swelling, lesions, rashes, or tenderness present. No penile discharge noted. Cremasteric reflex intact. RN served as Producer, television/film/video during the exam.   Musculoskeletal:     Cervical back: Normal range of motion. Tenderness present.       Back:     Comments: No midline spinal tenderness present in lumbar, thoracic or cervical spine. No step-off palpated. No visible bruising, edema or temperature change noted. No objective signs of numbness present. No saddle anesthesia. 2+ DP pulses bilaterally. Sensation intact to light touch. Strength 5/5 in  bilateral lower extremities.  Skin:    Findings: No rash.  Neurological:     Mental Status: He is alert.     ED Results / Procedures / Treatments   Labs (all labs ordered are listed, but only abnormal results are displayed) Labs Reviewed  RAPID HIV SCREEN (HIV 1/2 AB+AG)  RPR  GC/CHLAMYDIA PROBE AMP ([place]) NOT AT [facility]    EKG None  Radiology No results found.  Procedures Procedures (including critical care time)  Medications Ordered in ED Medications - No data to display  ED Course  I have reviewed the triage vital signs and the nursing notes.  Pertinent labs & imaging results that were available during my care of the patient were reviewed by me and considered in my medical decision making (see chart for details).    MDM Rules/Calculators/A&P                      Patient denies any concerning symptoms suggestive of cauda equina requiring urgent imaging at this time such as loss of sensation in the lower extremities, lower extremity weakness, loss of bowel or bladder control, saddle anesthesia, urinary retention, fever/chills, IVDU. Exam demonstrated no  weakness on exam today. No preceding injury or trauma to suggest acute fracture. Doubt pelvic or urinary pathology for patient's acute back pain, as patient denies urinary symptoms, but does endorse penile discharge and concern for STDs.  Doubt AAA as cause of patient's back pain as patient lacks major risk factors, had no abdominal TTP, and has symmetric and intact distal pulses. Patient given strict return precautions for any symptoms indicating worsening neurologic function in the lower extremities.  Will give Robaxin and continued NSAIDs for what appears to be musculoskeletal back pain. Patient would like to be treated for STDs if needed after results and not prophylactically.  Patient is hemodynamically stable, in NAD, and able to ambulate in the ED. Evaluation does not show pathology that would require ongoing  emergent intervention or inpatient treatment. I explained the diagnosis to the patient. Pain has been managed and has no complaints prior to discharge. Patient is comfortable with above plan and is stable for discharge at this time. All questions were answered prior to disposition. Strict return precautions for returning to the ED were discussed. Encouraged follow up with PCP.   An After Visit Summary was printed and given to the patient.   Portions of this note were generated with [employer]. Dictation errors may occur despite best attempts at proofreading.  Final Clinical Impression(s) / ED Diagnoses Final diagnoses:  Strain of lumbar region, initial encounter  Concern about STD in male without diagnosis    Rx / DC Orders ED Discharge Orders         Ordered  methocarbamol (ROBAXIN) 500 MG tablet  2 times daily     03/12/19 2128           Dietrich Pates, [place] 03/12/19 2132    Terald Sleeper, MD 03/13/19 [phone]

## 2019-03-12 NOTE — ED Notes (Signed)
Pt verbalized understanding of d/c instructions, follow up care, scripts and s/s requiring return to ed. Pt had no further questions at this time 

## 2019-03-12 NOTE — ED Notes (Signed)
Pt also reporting white discharge from penis and difficulty urinating.

## 2019-03-12 NOTE — ED Triage Notes (Signed)
Patient presents ambulatory c/o spine pain from the top to bottom x 3 days. Patient states he works for [employer] and lifts a lot. Pain is worse when lifting arms. Denies any issues with bowel or bladder.

## 2019-03-12 NOTE — Discharge Instructions (Addendum)
We will contact you with results of your remaining lab work when it is available. Take the medication as needed. Return to the ED if you start to experience worsening symptoms, develop abdominal pain or fever, numbness in your arms or legs, losing control of your bowels or bladder.

## 2019-03-13 LAB — RPR: RPR Ser Ql: NONREACTIVE

## 2019-03-16 LAB — GC/CHLAMYDIA PROBE AMP (~~LOC~~) NOT AT ARMC
Chlamydia: POSITIVE — AB
Neisseria Gonorrhea: POSITIVE — AB

## 2019-03-25 ENCOUNTER — Inpatient Hospital Stay (HOSPITAL_COMMUNITY)
Admission: RE | Admit: 2019-03-25 | Payer: Medicaid Other | Source: Ambulatory Visit | Admitting: PSYCHIATRY AND NEUROLOGY-NEUROLOGY WITH SPECIAL QUALIFICATIONS IN CHILD NEUROLOGY

## 2019-05-21 ENCOUNTER — Other Ambulatory Visit: Payer: Self-pay

## 2019-05-21 ENCOUNTER — Emergency Department (HOSPITAL_COMMUNITY)
Admission: EM | Admit: 2019-05-21 | Discharge: 2019-05-21 | Disposition: A | Discharge status: Home / Self Care | Payer: Self-pay | Attending: Emergency Medicine | Admitting: Emergency Medicine

## 2019-05-21 DIAGNOSIS — J45909 Unspecified asthma, uncomplicated: Secondary | ICD-10-CM | POA: Insufficient documentation

## 2019-05-21 DIAGNOSIS — M546 Pain in thoracic spine: Secondary | ICD-10-CM | POA: Insufficient documentation

## 2019-05-21 DIAGNOSIS — F909 Attention-deficit hyperactivity disorder, unspecified type: Secondary | ICD-10-CM | POA: Insufficient documentation

## 2019-05-21 DIAGNOSIS — F17210 Nicotine dependence, cigarettes, uncomplicated: Secondary | ICD-10-CM | POA: Insufficient documentation

## 2019-05-21 DIAGNOSIS — Z79899 Other long term (current) drug therapy: Secondary | ICD-10-CM | POA: Insufficient documentation

## 2019-05-21 MED ORDER — NAPROXEN 375 MG PO TABS: 375.0000 mg | ORAL_TABLET | Freq: Two times a day (BID) | ORAL | 0 refills | Status: AC

## 2019-05-21 MED ORDER — LIDOCAINE 5 % EX PTCH: 1.0000 | MEDICATED_PATCH | CUTANEOUS | 0 refills | Status: AC

## 2019-05-21 MED ORDER — NAPROXEN 375 MG PO TABS: 375.0000 mg | ORAL_TABLET | Freq: Two times a day (BID) | ORAL | 0 refills | Status: DC

## 2019-05-21 MED ORDER — METHOCARBAMOL 500 MG PO TABS: 500.0000 mg | ORAL_TABLET | Freq: Two times a day (BID) | ORAL | 0 refills | Status: DC

## 2019-05-21 MED ORDER — LIDOCAINE 5 % EX PTCH: 1.0000 | MEDICATED_PATCH | CUTANEOUS | 0 refills | Status: DC

## 2019-05-21 NOTE — ED Notes (Signed)
Patient verbalizes understanding of discharge instructions . Opportunity for questions and answers were provided . Armband removed by staff ,Pt discharged from ED. W/C  offered at D/C  and Declined W/C at D/C and was escorted to lobby by RN.  

## 2019-05-21 NOTE — Discharge Instructions (Signed)
Use lidocaine patches or take naproxen as needed for pain.  You may also take Robaxin as needed for muscle spasms.  No driving or operating heavy machinery on Robaxin.  Follow-up with orthopedics for persistent back pain.  Return to the emergency room at immediately for new or worsening symptoms or concerns, such as numbness, tingling, difficulty walking, peeing or pooping on yourself or any concerns at all.

## 2019-05-21 NOTE — ED Triage Notes (Signed)
PT reports he works for [employer] and has back pain . Pt reports several months ago he back pain also.

## 2019-05-21 NOTE — ED Provider Notes (Signed)
[place] EMERGENCY DEPARTMENT Provider Note   CSN: [id number] Arrival date & time: 05/21/19  1355     History Chief Complaint  Patient presents with  . Back Pain    Keith Pearson is a 24 y.o. male.  HPI    24 year old male presents with back pain.  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  He notes that today his back was hurting too bad for him to go to work.  He denies any bowel or bladder incontinence, saddle anesthesias, numbness, tingling, difficulty ambulating.  Denies any direct injury or trauma to the back.  He denies any other complaints of fevers, chills, nausea, vomiting, abdominal pain, dysuria, hematuria.   Past Medical History:  Diagnosis Date  . ADD (attention deficit disorder)   . Asthma   . Bipolar 1 disorder ([place])   . OCD (obsessive compulsive disorder)   . Seizures ([place])     There are no problems to display for this patient.   No past surgical history on file.     No family history on file.  Social History   Tobacco Use  . Smoking status: Current Every Day Smoker    Types: Cigarettes  . Smokeless tobacco: Never Used  Substance Use Topics  . Alcohol use: Yes  . Drug use: Yes    Types: Marijuana    Home Medications Prior to Admission medications   Medication Sig Start Date End Date Taking? Authorizing Provider  methocarbamol (ROBAXIN) 500 MG tablet Take 1 tablet (500 mg total) by mouth 2 (two) times daily. 03/12/19   Khatri, Hina, PA-C  ondansetron (ZOFRAN) 4 MG tablet Take 1 tablet (4 mg total) by mouth every 8 (eight) hours as needed for nausea or vomiting. 01/21/19   Couture, Cortni S, PA-C    Allergies    Augmentin [amoxicillin-pot clavulanate] and Penicillins  Review of Systems   Review of Systems  Constitutional: Negative for chills and fever.  Respiratory: Negative for shortness of  breath.   Cardiovascular: Negative for chest pain.  Gastrointestinal: Negative for abdominal pain, nausea and vomiting.  Musculoskeletal: Positive for back pain.    Physical Exam Updated Vital Signs BP 118/84 (BP Location: Right Arm)   Pulse 84   Temp 98.2 F (36.8 C) (Oral)   Resp 14   Ht 6\' 1"  (1.854 m)   SpO2 97%   Physical Exam Vitals and nursing note reviewed.  Constitutional:      Appearance: He is well-developed.  HENT:     Head: Normocephalic and atraumatic.  Eyes:     Conjunctiva/sclera: Conjunctivae normal.  Cardiovascular:     Rate and Rhythm: Normal rate and regular rhythm.     Heart sounds: Normal heart sounds. No murmur.  Pulmonary:     Effort: Pulmonary effort is normal. No respiratory distress.     Breath sounds: Normal breath sounds. No wheezing or rales.  Abdominal:     General: Bowel sounds are normal. There is no distension.     Palpations: Abdomen is soft.     Tenderness: There is no abdominal tenderness.  Musculoskeletal:        General: No deformity. Normal range of motion.     Cervical back: Neck supple.     Thoracic back: Tenderness (mild tenderness of paraspinal T8-T12) present. No bony tenderness.     Lumbar back: Normal.  Skin:  General: Skin is warm and dry.     Findings: No erythema or rash.  Neurological:     Mental Status: He is alert and oriented to person, place, and time.  Psychiatric:        Behavior: Behavior normal.     ED Results / Procedures / Treatments   Labs (all labs ordered are listed, but only abnormal results are displayed) Labs Reviewed - No data to display  EKG None  Radiology No results found.  Procedures Procedures (including critical care time)  Medications Ordered in ED Medications - No data to display  ED Course  I have reviewed the triage vital signs and the nursing notes.  Pertinent labs & imaging results that were available during my care of the patient were reviewed by me and considered in  my medical decision making (see chart for details).    MDM Rules/Calculators/A&P                       Patient presents with back pain.  He states he has been getting back pain intermittently since being at [employer].  He has mildly tender over the paraspinal C8-T12.  No bony tenderness.  Patient moving all extremities, strength 5 out of 5 in all extremities.  Normal DTRs in bilateral lower extremities.  No saddle anesthesias, bowel or bladder incontinence.  History and physical not consistent with cauda equina.  No urinary symptoms or CVA tenderness.  No IV drug abuse or fevers.  Offered x-ray to further evaluate patient's thoracic spine given persistent pain and patient declined.  Would like to manage with medication and follow-up outpatient.  Encouraged follow-up with Ortho.  Patient ready and stable for discharge.   At this time there does not appear to be any evidence of an acute emergency medical condition and the patient appears stable for discharge with appropriate outpatient follow up.Diagnosis was discussed with patient who verbalizes understanding and is agreeable to discharge.    Final Clinical Impression(s) / ED Diagnoses Final diagnoses:  None    Rx / DC Orders ED Discharge Orders    None       Rueben Bash 05/22/19 1107    Jacalyn Lefevre, MD 05/27/19 1205

## 2020-04-19 ENCOUNTER — Other Ambulatory Visit (HOSPITAL_COMMUNITY): Payer: Self-pay

## 2020-04-19 LAB — EXTERNAL COVID-19 MOLECULAR RESULT: External 2019-n-CoV/SARS-CoV-2: POSITIVE — AB

## 2020-08-15 ENCOUNTER — Emergency Department (HOSPITAL_COMMUNITY)
Admission: EM | Admit: 2020-08-15 | Discharge: 2020-08-15 | Disposition: A | Discharge status: Home / Self Care | Payer: Medicaid - Out of State | Attending: Emergency Medicine | Admitting: Emergency Medicine

## 2020-08-15 ENCOUNTER — Other Ambulatory Visit: Payer: Self-pay

## 2020-08-15 ENCOUNTER — Emergency Department (HOSPITAL_COMMUNITY): Payer: Medicaid - Out of State

## 2020-08-15 ENCOUNTER — Encounter (HOSPITAL_COMMUNITY): Payer: Self-pay

## 2020-08-15 DIAGNOSIS — H6123 Impacted cerumen, bilateral: Secondary | ICD-10-CM | POA: Diagnosis not present

## 2020-08-15 DIAGNOSIS — S39012A Strain of muscle, fascia and tendon of lower back, initial encounter: Secondary | ICD-10-CM | POA: Diagnosis not present

## 2020-08-15 DIAGNOSIS — Y92410 Unspecified street and highway as the place of occurrence of the external cause: Secondary | ICD-10-CM | POA: Insufficient documentation

## 2020-08-15 DIAGNOSIS — J45909 Unspecified asthma, uncomplicated: Secondary | ICD-10-CM | POA: Insufficient documentation

## 2020-08-15 DIAGNOSIS — Z87891 Personal history of nicotine dependence: Secondary | ICD-10-CM | POA: Diagnosis not present

## 2020-08-15 DIAGNOSIS — Z789 Other specified health status: Secondary | ICD-10-CM

## 2020-08-15 DIAGNOSIS — S3992XA Unspecified injury of lower back, initial encounter: Secondary | ICD-10-CM | POA: Diagnosis present

## 2020-08-15 MED ORDER — CARBAMIDE PEROXIDE 6.5 % OT SOLN: 5.0000 [drp] | Freq: Two times a day (BID) | OTIC | 0 refills | Status: AC

## 2020-08-15 MED ORDER — METHOCARBAMOL 500 MG PO TABS: 500.0000 mg | ORAL_TABLET | Freq: Two times a day (BID) | ORAL | 0 refills | Status: DC
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
## 2020-08-15 NOTE — Discharge Instructions (Signed)
Take medications to help with your symptoms. Use the eardrops to help remove earwax. Follow-up with a primary care provider or the 1 listed below. Return to the ER if you start to experience fever, worsening back pain, injuries, numbness in legs or losing control of your bladder.
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
## 2021-01-03 ENCOUNTER — Emergency Department (HOSPITAL_COMMUNITY)
Admission: EM | Admit: 2021-01-03 | Discharge: 2021-01-03 | Disposition: A | Discharge status: Home / Self Care | Payer: Medicaid - Out of State | Attending: Emergency Medicine | Admitting: Emergency Medicine

## 2021-01-03 ENCOUNTER — Emergency Department (HOSPITAL_COMMUNITY): Payer: Medicaid - Out of State

## 2021-01-03 ENCOUNTER — Encounter (HOSPITAL_COMMUNITY): Payer: Self-pay | Admitting: Emergency Medicine

## 2021-01-03 DIAGNOSIS — Z87891 Personal history of nicotine dependence: Secondary | ICD-10-CM | POA: Insufficient documentation

## 2021-01-03 DIAGNOSIS — M545 Low back pain, unspecified: Secondary | ICD-10-CM | POA: Insufficient documentation

## 2021-01-03 DIAGNOSIS — J45909 Unspecified asthma, uncomplicated: Secondary | ICD-10-CM | POA: Diagnosis not present

## 2021-01-03 DIAGNOSIS — R202 Paresthesia of skin: Secondary | ICD-10-CM | POA: Insufficient documentation

## 2021-01-03 DIAGNOSIS — G8929 Other chronic pain: Secondary | ICD-10-CM | POA: Diagnosis not present

## 2021-01-03 DIAGNOSIS — Z79899 Other long term (current) drug therapy: Secondary | ICD-10-CM | POA: Insufficient documentation

## 2021-01-03 MED ORDER — PREDNISONE 20 MG PO TABS: 40.0000 mg | ORAL_TABLET | Freq: Every day | ORAL | 0 refills | Status: AC

## 2021-01-03 MED ORDER — METHOCARBAMOL 500 MG PO TABS
500.0000 mg | ORAL_TABLET | Freq: Once | ORAL | Status: AC
  Administered 2021-01-03: 500 mg via ORAL
  Filled 2021-01-03: qty 1

## 2021-01-03 MED ORDER — METHOCARBAMOL 500 MG PO TABS: 500.0000 mg | ORAL_TABLET | Freq: Two times a day (BID) | ORAL | 0 refills | Status: AC

## 2021-01-03 MED ORDER — DEXAMETHASONE SODIUM PHOSPHATE 10 MG/ML IJ SOLN
10.0000 mg | Freq: Once | INTRAMUSCULAR | Status: AC
  Administered 2021-01-03: 10 mg via INTRAMUSCULAR
  Filled 2021-01-03: qty 1

## 2021-01-03 MED ORDER — IBUPROFEN 800 MG PO TABS
800.0000 mg | ORAL_TABLET | Freq: Once | ORAL | Status: AC
  Administered 2021-01-03: 800 mg via ORAL
  Filled 2021-01-03: qty 1

## 2021-01-03 NOTE — ED Notes (Signed)
Patient verbalizes understanding of discharge instructions. Opportunity for questioning and answers were provided. Armband removed by staff, pt discharged from ED ambulatory.   
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
## 2021-01-03 NOTE — ED Triage Notes (Signed)
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 Work would not let him work due to lower back pain and swelling.
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
## 2023-09-14 IMAGING — DX DG LUMBAR SPINE COMPLETE 4+V
5 series · 5 of 5 positions shown · non-contrast
Comparison: 08/15/2020

CLINICAL DATA: Back pain

EXAM:
LUMBAR SPINE - COMPLETE 4+ VIEW

[l-spine ap]
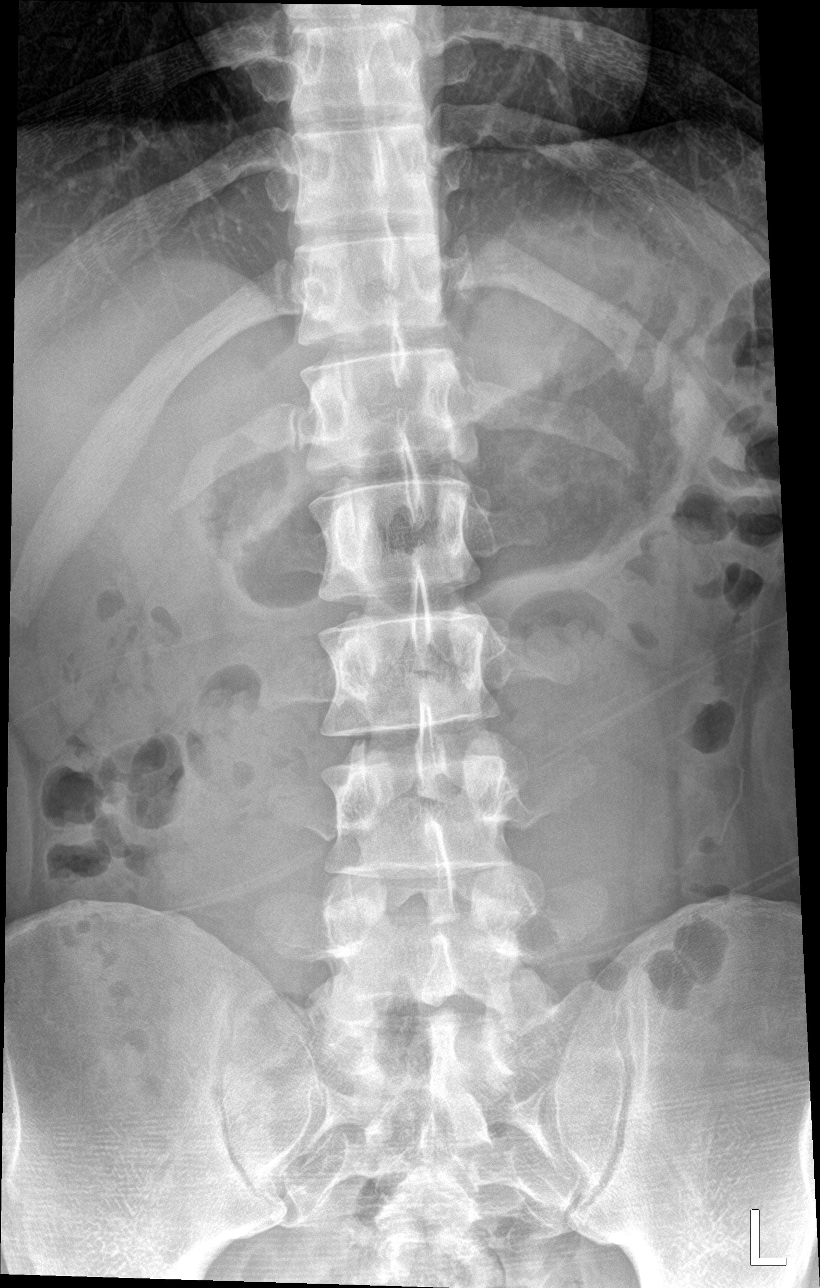

[l-spine obl (1 of 2)]
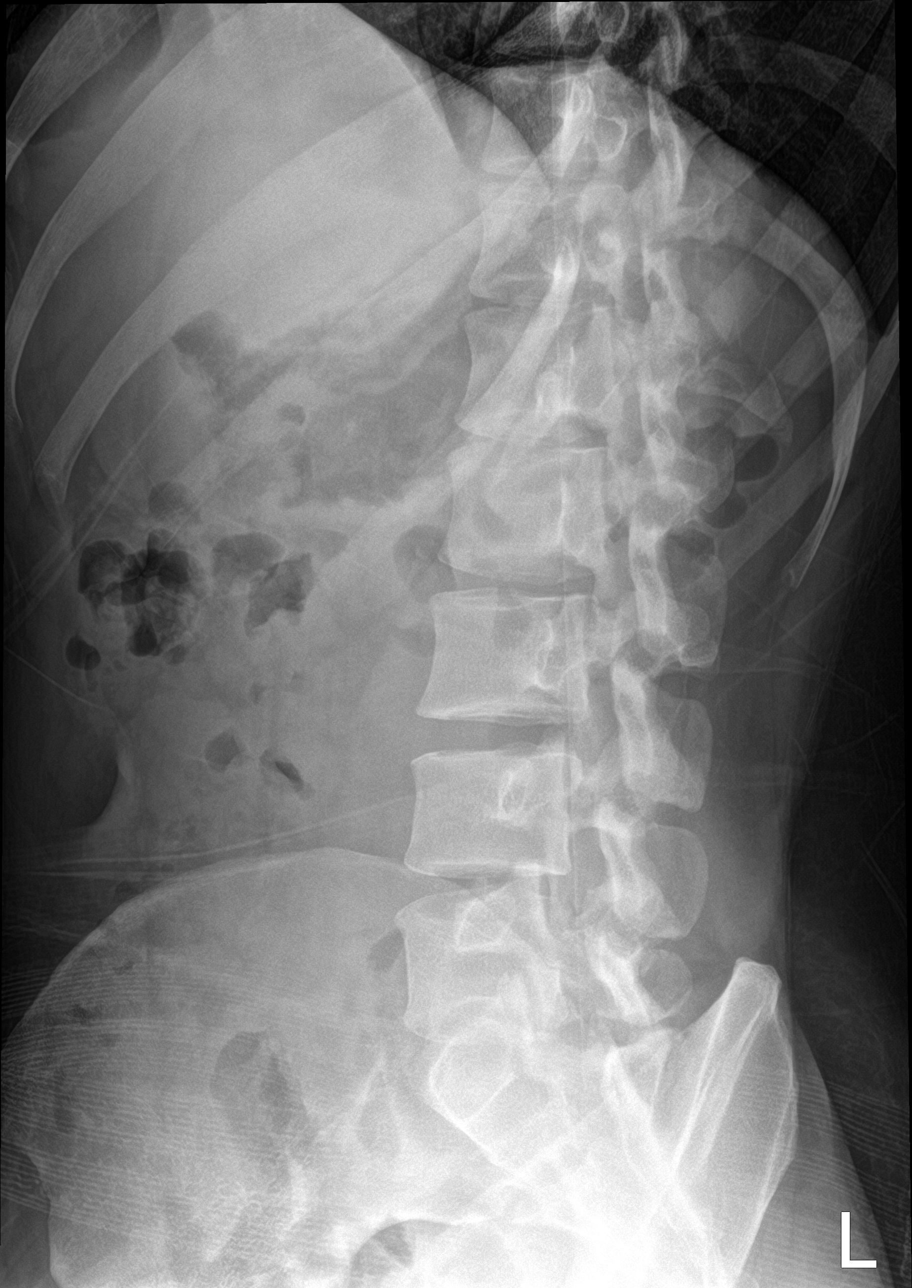

[l-spine obl (2 of 2)]
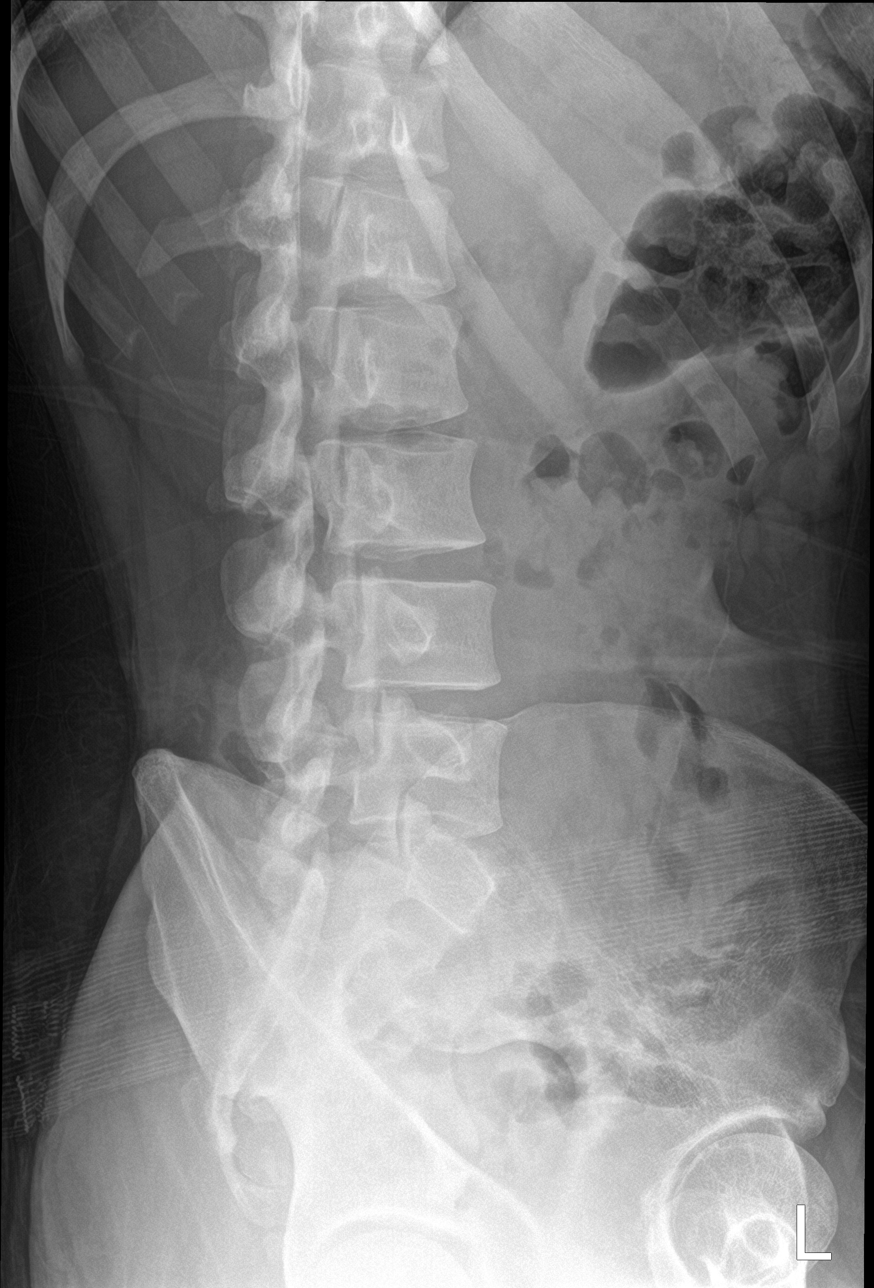

[l-spine lat]
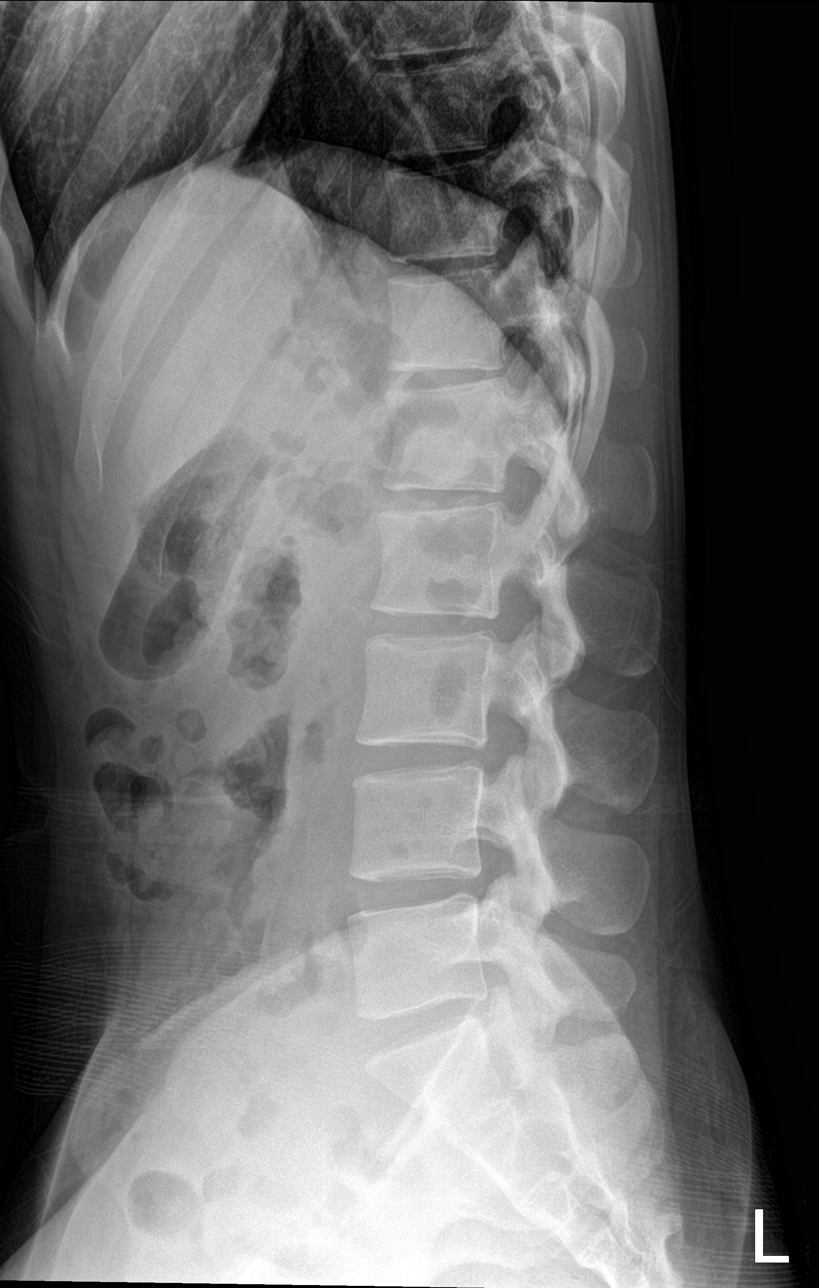

[l-spine spot]
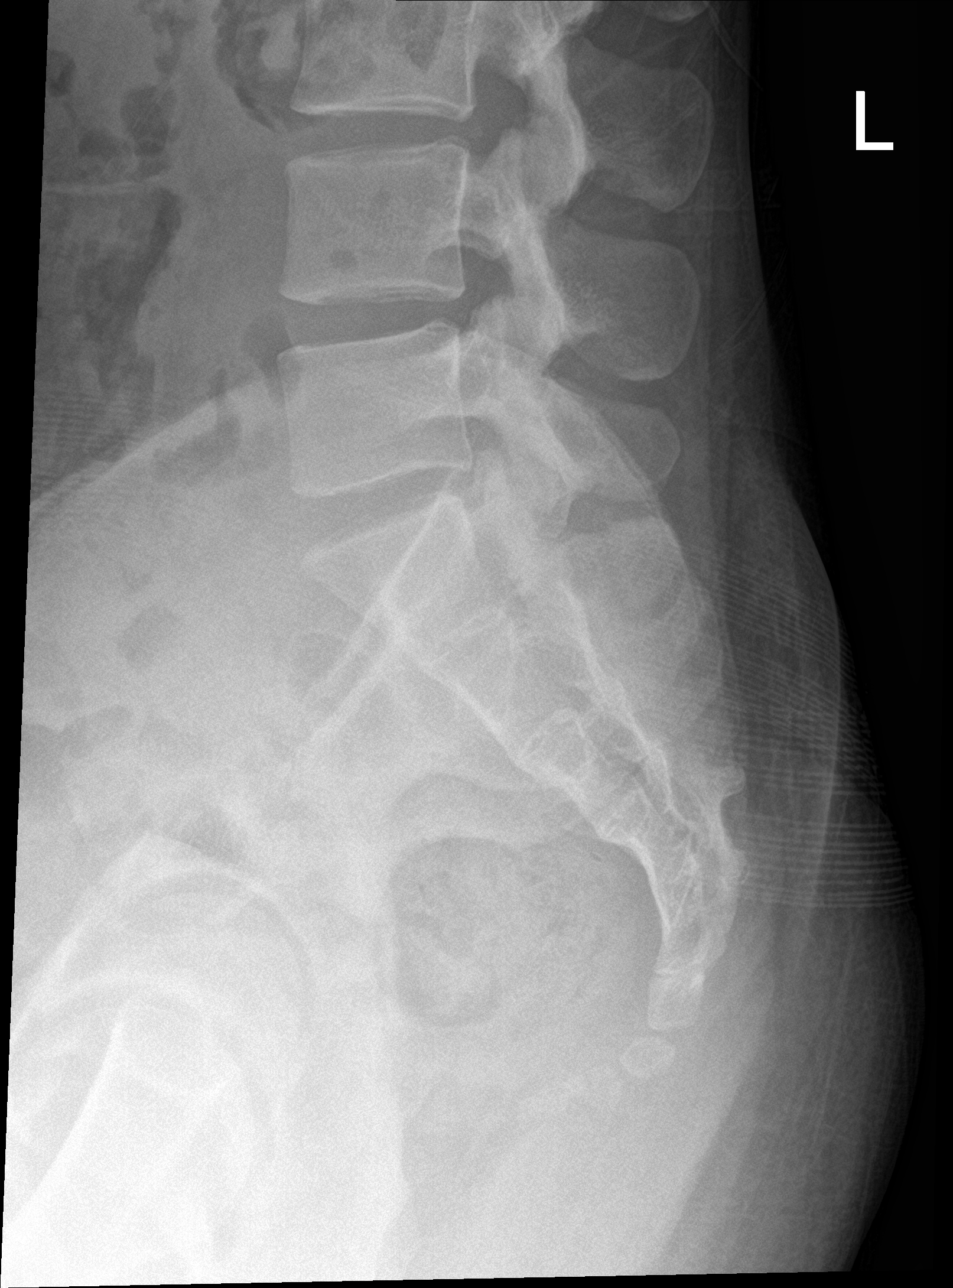

[5 of 5 positions shown; findings below may reference images not displayed]

FINDINGS: Frontal, bilateral oblique, lateral views of the lumbar spine are
obtained. There are 4 non-rib-bearing lumbar type vertebral bodies
in grossly normal anatomic alignment. Disc spaces are well
preserved. Sacroiliac joints are unremarkable.
IMPRESSION: 1. Unremarkable lumbar spine.
